# Patient Record
Sex: Male | Born: 1965
Health system: Southern US, Community
[De-identification: ages and names within clinical notes are randomized; demographics above are authoritative.]

---

## 2008-07-24 ENCOUNTER — Emergency Department (HOSPITAL_COMMUNITY): Admission: EM | Admit: 2008-07-24 | Discharge: 2008-07-24 | Payer: Self-pay | Admitting: Family Medicine

## 2011-08-05 LAB — POCT RAPID STREP A: Streptococcus, Group A Screen (Direct): NEGATIVE

## 2013-03-25 ENCOUNTER — Ambulatory Visit: Payer: Self-pay | Admitting: Psychologist

## 2013-03-26 ENCOUNTER — Ambulatory Visit: Payer: Self-pay | Admitting: Psychologist

## 2013-10-24 ENCOUNTER — Encounter (HOSPITAL_COMMUNITY): Payer: Self-pay | Admitting: Emergency Medicine

## 2013-10-24 ENCOUNTER — Emergency Department (INDEPENDENT_AMBULATORY_CARE_PROVIDER_SITE_OTHER)
Admission: EM | Admit: 2013-10-24 | Discharge: 2013-10-24 | Disposition: A | Discharge status: Home / Self Care | Payer: BC Managed Care – PPO | Source: Home / Self Care | Attending: Family Medicine | Admitting: Family Medicine

## 2013-10-24 DIAGNOSIS — J069 Acute upper respiratory infection, unspecified: Secondary | ICD-10-CM

## 2013-10-24 MED ORDER — AZITHROMYCIN 250 MG PO TABS: 250.0000 mg | ORAL_TABLET | Freq: Every day | ORAL | Status: AC

## 2013-10-24 MED ORDER — GUAIFENESIN-CODEINE 100-10 MG/5ML PO SOLN
5.0000 mL | Freq: Every evening | ORAL | Status: AC | PRN
Start: 2013-10-24 — End: ?

## 2013-10-24 MED ORDER — PREDNISONE 20 MG PO TABS: 40.0000 mg | ORAL_TABLET | Freq: Every day | ORAL | Status: DC

## 2013-10-24 MED ORDER — IPRATROPIUM BROMIDE 0.06 % NA SOLN: 2.0000 | Freq: Four times a day (QID) | NASAL | Status: AC

## 2013-10-24 NOTE — ED Provider Notes (Signed)
Jay Pope is a 47 y.o. male who presents to Urgent Care today for 5 days of sore throat congestion runny nose ear pain nighttime nonproductive coughing. Dr. was sick last week with a similar illness. Patient has tried NyQuil which has helped.. No nausea vomiting or diarrhea. Patient feels well otherwise.   History reviewed. No pertinent past medical history. History  Substance Use Topics  . Smoking status: Never Smoker   . Smokeless tobacco: Not on file  . Alcohol Use: No   ROS as above Medications reviewed. No current facility-administered medications for this encounter.   Current Outpatient Prescriptions  Medication Sig Dispense Refill  . azithromycin (ZITHROMAX) 250 MG tablet Take 1 tablet (250 mg total) by mouth daily. Take first 2 tablets together, then 1 every day until finished.  6 tablet  0  . guaiFENesin-codeine 100-10 MG/5ML syrup Take 5 mLs by mouth at bedtime as needed for cough.  120 mL  0  . ipratropium (ATROVENT) 0.06 % nasal spray Place 2 sprays into both nostrils 4 (four) times daily.  15 mL  1  . predniSONE (DELTASONE) 20 MG tablet Take 2 tablets (40 mg total) by mouth daily.  10 tablet  0    Exam:  BP 120/72  Pulse 74  Temp(Src) 97.8 F (36.6 C) (Oral)  Resp 18  SpO2 96% Gen: Well NAD HEENT: EOMI,  MMM tympanic membranes are normal appearing bilaterally. Posterior pharynx with mild cobblestoning. Nontender  maxillary sinus Lungs: Normal work of breathing. CTABL Heart: RRR no MRG Abd: NABS, Soft. NT, ND Exts: Non edematous BL  LE, warm and well perfused.    Assessment and Plan: 47 y.o. male with viral URI. Plan for treatment with codeine containing cough medication Atrovent nasal spray and low dose or duration prednisone. We'll use azithromycin for use if not getting better. Discussed warning signs or symptoms. Please see discharge instructions. Patient expresses understanding.      Rodolph Bong, MD 10/24/13 [id number]

## 2013-10-24 NOTE — ED Notes (Signed)
C/O slight sore throat, fatigue, nasal congestion x 5 days.  No relief with Nyquil.  Daughter had influenza approx 2 wks ago.  Has been drinking plenty of fluids and also taking Advil.  Denies any fevers.

## 2014-12-21 ENCOUNTER — Ambulatory Visit
Admission: RE | Admit: 2014-12-21 | Discharge: 2014-12-21 | Disposition: A | Discharge status: Home / Self Care | Payer: BLUE CROSS/BLUE SHIELD | Source: Ambulatory Visit | Attending: Sports Medicine | Admitting: Sports Medicine

## 2014-12-21 ENCOUNTER — Ambulatory Visit (INDEPENDENT_AMBULATORY_CARE_PROVIDER_SITE_OTHER): Payer: BLUE CROSS/BLUE SHIELD | Admitting: Sports Medicine

## 2014-12-21 ENCOUNTER — Encounter: Payer: Self-pay | Admitting: Sports Medicine

## 2014-12-21 VITALS — BP 111/70 | HR 65 | Ht 69.0 in | Wt 180.0 lb

## 2014-12-21 DIAGNOSIS — M533 Sacrococcygeal disorders, not elsewhere classified: Secondary | ICD-10-CM | POA: Diagnosis not present

## 2014-12-21 DIAGNOSIS — M545 Low back pain, unspecified: Secondary | ICD-10-CM

## 2014-12-21 DIAGNOSIS — M4120 Other idiopathic scoliosis, site unspecified: Secondary | ICD-10-CM | POA: Diagnosis not present

## 2014-12-21 NOTE — Patient Instructions (Signed)
We will obtain updated X-rays of the lumbar spine and contact you about the results.  The following exercise regimen will help with rehab and stabilize your spine.  On days 1,3, and 5: 1. Standing with 5-10 lb bar or weight resting across your shoulders, rotate to left until you feel a stretch, then back to midline. Repeat to opposite side, do 10 x 3 sets. 2. Standing with 5-10 lb bar or weight resting across your shoulders, side-bend to left until you feel a stretch, then back to midline. Repeat to opposite side, do 10 x 3 sets. 3. Standing with 5-10 lb bar or weight resting across your shoulders, bend down with your right elbow going towards your left knee (opposite elbow to knee), stop when you start to feel gentle stretch in your back. Repeat to opposite side, do 10 x 3 sets.  On days 2,4,6: 1. Laying on your back, start with stretch bringing both knees to your chest, hold 5 seconds, then down. Once stretched, do crunches with both knees to chest, 15 times x 3 sets. 2. Laying on your back, stretch bringing opposite knee to opposite elbow, hold 5 seconds, then down. Once stretched, do crunches with opposite elbow to opposite knee, 15 times x 3 sets. Repeat for opposite elbow/knee. 3. On hands and knees, do superman exercise by reaching with one arm and opposite leg extending back. 10 x 3 sets.  Daily: Pretzel stretch (Swing opposite leg over the other off side of couch or bed), hold 10 seconds, repeat 3-5 times.  Follow-up in 6 weeks or sooner if needed.

## 2014-12-21 NOTE — Assessment & Plan Note (Signed)
Thoracic right side-bent, left rotation Lumbar left side-bent, right rotation and right lumbar spasm to SI joint  -Obtain X-rays lumbar spine, will contact patient regarding x-rays. -Extensive HEP to include standing rotation/sidebending exercises, core strengthening, superman, and pretzel stretch -Follow-up in 6 weeks or sooner if needed.

## 2014-12-21 NOTE — Progress Notes (Addendum)
   Subjective:    Patient ID: Jay Pope, male    DOB: [birth year], 49 y.o.   MRN: [id number]  HPI [name] is a 49 year old male who presents with low back pain.  He is a former [employer] for 15 years, and has been dealing with some chronic low back pain. He denies any acute injury.  He was last seen in the mid 90s by Dr. Felix Pacini, and told that he has a sixth lumbar vertebra and a possible disc protrusion at L4-5.  Over the past couple months his pain has been increasing.  Location of pain is in the right low back and in the region of the right SI joint.  He is right-handed Air cabin crew.  Symptoms are a red with movement or twisting.  He occasionally he will note radiation that is sharp and causes right leg to feel like it is going to give way.  He denies any numbness, tingling, or constant weakness.  He takes Advil and stretches occasionally.  He denies any saddle anesthesia, or loss of bladder or bowel.  Past medical history, social history, medications, and allergies were reviewed and are up to date in the chart.  Review of Systems 7 point review of systems was performed and was otherwise negative unless noted in the history of present illness.     Objective:   Physical Exam BP 111/70 mmHg  Pulse 65  Ht 5\' 9"  (1.753 m)  Wt 180 lb (81.647 kg)  BMI 26.57 kg/m2 GEN: The patient is well-developed well-nourished male and in no acute distress.  He is awake alert and oriented x3. SKIN: warm and well-perfused, no rash  EXTR: No lower extremity edema Neuro: Strength 5/5 globally. Sensation intact throughout. DTRs 2/4 bilaterally. No focal deficits. Vasc: +2 bilateral distal pulses. No edema.  MSK: Examination of the lumbar spine standing structurally reveals a right-sided thoracic curve and tightness along the right lumbar paraspinal muscles.  He has a S-shaped scoliosis.  No leg length discrepancy.  Abdominal compression reveals slight tenderness at the right SI joint with poor  motion.  Negative Faber.  Clearly good hip abductor strength.  Weak core musculature.  The scoliotic curve is slightly reducible with side bending.  Negative seated straight leg raise test.  He is overall neurovascularly intact distally.  No foot drop.     Assessment & Plan:  Please see problem based assessment and plan in the problem list.    Addendum:  Xrays showed a bilateral pars defect with some anterolithesis and excess lordosis at L6/S1  I called and left message for patient  We will focus his rehab on lots of abdominal strength work during first 12 weeks

## 2016-06-03 DIAGNOSIS — Z0000 Encounter for general adult medical examination without abnormal findings: Secondary | ICD-10-CM | POA: Diagnosis not present

## 2016-06-03 DIAGNOSIS — Z79899 Other long term (current) drug therapy: Secondary | ICD-10-CM | POA: Diagnosis not present

## 2016-06-03 DIAGNOSIS — Z125 Encounter for screening for malignant neoplasm of prostate: Secondary | ICD-10-CM | POA: Diagnosis not present

## 2016-06-03 DIAGNOSIS — E7800 Pure hypercholesterolemia, unspecified: Secondary | ICD-10-CM | POA: Diagnosis not present

## 2016-12-19 ENCOUNTER — Ambulatory Visit (INDEPENDENT_AMBULATORY_CARE_PROVIDER_SITE_OTHER): Payer: BLUE CROSS/BLUE SHIELD | Admitting: Sports Medicine

## 2016-12-19 ENCOUNTER — Ambulatory Visit: Payer: Self-pay

## 2016-12-19 VITALS — BP 114/70 | Ht 69.0 in | Wt 180.0 lb

## 2016-12-19 DIAGNOSIS — S62235A Other nondisplaced fracture of base of first metacarpal bone, left hand, initial encounter for closed fracture: Secondary | ICD-10-CM | POA: Diagnosis not present

## 2016-12-19 DIAGNOSIS — M25539 Pain in unspecified wrist: Secondary | ICD-10-CM

## 2016-12-19 DIAGNOSIS — S62233A Other displaced fracture of base of first metacarpal bone, unspecified hand, initial encounter for closed fracture: Secondary | ICD-10-CM | POA: Insufficient documentation

## 2016-12-19 DIAGNOSIS — S62002A Unspecified fracture of navicular [scaphoid] bone of left wrist, initial encounter for closed fracture: Secondary | ICD-10-CM | POA: Diagnosis not present

## 2016-12-19 NOTE — Progress Notes (Signed)
   Subjective:    Patient ID: Jay Pope, male    DOB: [birth year], 51 y.o.   MRN: [id number]  HPI Jay Pope is a 51 year old male who has previously been seen by sports medicine for low back pain, found to have bilateral pars defect at L6/S1. He is seen today for a hand injury obtained while skiing. On Saturday (5 days ago) he fell skiing and landed on his hand close to the base of the thumb. He says he had some tenderness there and a lot of swelling. He tried icing and rest of the thumb. Last night the swelling did improve. However, still mildly swollen compared to right side. He didn't notice any erythema or bruising. No warmth. His biggest complaint today is decreased grip strength. He is right handed, but has noticed even decreased strength in his left hand compared to his right than normal. No prior injuries to the hand or wrist.  Past medical history, social history, medications, and allergies were reviewed and are up to date in the chart.  Review of Systems 7 point review of systems was performed and was otherwise negative unless noted in the history of present illness.     Objective:   Physical Exam BP 114/70   Ht 5\' 9"  (1.753 m)   Wt 180 lb (81.6 kg)   BMI 26.58 kg/m  GEN: The patient is well-developed well-nourished male and in no acute distress.  He is awake alert and oriented x3. SKIN: warm and well-perfused, no rash  EXTR: No lower extremity edema Neuro: Strength 5/5 globally in right hand. Strength 4/5 in left hand with grip strength and thumb opposition strength. Sensation intact throughout. Vasc: +2 bilateral distal pulses. Good capillary refill bilaterally. MSK: Examination of the right hand and left hand reveals swelling noted to the thenar aspect of the palm. Point tenderness noted close to the [facility] joint and scaphoid bone. No bony deformities noted. No erythema or bruising noted. Swelling noted to back of left hand compared to the right hand.      Assessment &  Plan:  Please see problem based assessment and plan in the problem list.  1. Closed nondisplaced fracture of scaphoid of left wrist, unspecified portion of scaphoid, initial encounter - [place] LIMITED JOINT SPACE STRUCTURES UP LEFT- chip fracture noted within [facility] joint. Edema noted surrounding joint space. Appears to be free-floating. Normal appearance of scaphoid bone.  - will place in spica splint (covering half of wrist) for 4 weeks - follow-up in 1 month  Patient seen and discussed with sports medicine physician, Dr. Oneida Alar.  Freddrick March, MD [facility] Pediatric[facility], PGY-3 12/19/2016  10:19 AM

## 2016-12-19 NOTE — Patient Instructions (Signed)
1. Wear splint for 4 weeks. 2. Follow-up with Dr. Oneida Alar in 1 month.

## 2017-01-16 ENCOUNTER — Ambulatory Visit (INDEPENDENT_AMBULATORY_CARE_PROVIDER_SITE_OTHER): Payer: BLUE CROSS/BLUE SHIELD | Admitting: Sports Medicine

## 2017-01-16 ENCOUNTER — Encounter (INDEPENDENT_AMBULATORY_CARE_PROVIDER_SITE_OTHER): Payer: Self-pay

## 2017-01-16 ENCOUNTER — Encounter: Payer: Self-pay | Admitting: Sports Medicine

## 2017-01-16 VITALS — BP 126/68

## 2017-01-16 DIAGNOSIS — S62235D Other nondisplaced fracture of base of first metacarpal bone, left hand, subsequent encounter for fracture with routine healing: Secondary | ICD-10-CM

## 2017-01-16 NOTE — Progress Notes (Signed)
  Jay Pope - 51 y.o. male MRN [id number]  Date of birth: 1966-03-19  SUBJECTIVE:  Including CC & ROS.  CC: f/u chip fracture L CMC joint  HPI: Jay Pope is an otherwise healthy 51 yr old male with was diagnosed with a L chip fracture in [facility] joint on 12/19/2016 after a fall during skiing. At that visit, he was placed in a wrist spica. He has been wearing his spica splint consistently, only removing for showers. He reports complete resolution of hand/wrist pain. Denies any swelling of area. No numbness/tingling in hand or fingers. Reports good range of motion in hand/wrist when not wearing the splint. No medications required at this time. Prior to this fracture, he had no hx of injuries in this hand.  No new injuries. He is R handed.   ROS: No fever, chills, swelling, instability, muscle pain, numbness/tingling, redness, otherwise see HPI   PMHx - Updated and reviewed.  Contributory factors include: Negative PSHx - Updated and reviewed.  Contributory factors include:  Negative FHx - Updated and reviewed.  Contributory factors include:  Negative Social Hx - Updated and reviewed. Contributory factors include: Negative Medications - reviewed, none Allergies: ASA  PHYSICAL EXAM:  VS: BP:126/68  HR: bpm  TEMP: ( )  RESP:   HT:    WT:   BMI:  PHYSICAL EXAM: Gen: NAD, alert, cooperative with exam, well-appearing HEENT: clear conjunctiva,  CV:  no edema, capillary refill brisk, normal rate Resp: non-labored Skin: no rashes, normal turgor  Neuro: no gross deficits.  Psych:  alert and oriented MSK: Hands/wrists exam: No edema, erythema, bruising, or bony deformity. No tenderness to palpation on distal forearms, wrists, carpals, metacarpals, or phalanges. No snuffbox or CMC tenderness. Good ROM throughout wrist and hand. Normal thumb apposition, opposition, abduction, and adduction. Normal hand strength, equal between R and L hand. Good grip strength bilaterally. Normal stability at ulnar  collateral ligament. Cap refill normal. Neurovascularly intact.  DATA REVIEWED: Ultrasound read from last visit 12/19/2016 reviewed.  Ultrasound today: Limited ultrasound of the left [facility] joint shows that the previous avulsion fracture has healed. Minimal joint fluid seen.  ASSESSMENT & PLAN:  51yr old otherwise healthy male with chip fracture in L [place] joint diagnosed on 12/19/2016 is here for follow-up. Overall, he is doing well with complete resolution of L hand/wrist pain and edema, and return to normal hand/wrist function. Ultrasound today reveals new bone growth and reattaching of bone chip. Normal scaphoid on ultrasound. No new abnormal findings.  1) Fracture in [facility]  -May resume normal activity, but avoid excessive gripping, twisting, or lifting motions with wrist and hand -Recommended gentle ROM exercises -May tape joint with coban if doing prolonged activity  Follow-up PRN for new or worsening symptoms.  Thereasa Distance, MD [facility], PGY1  Patient seen and evaluated with the resident. I agree with the above plan of care. Patient's ultrasound today suggests healing of his previous chip fracture at his [facility] joint in his left hand. Clinically he is doing very well. No pain. Good strength. He can discontinue his thumb spica brace but I still want him to avoid any heavy lifting or twisting for the next 3-4 weeks. We've given him some Coban and to wrap around his [facility] joint when active. Follow-up as needed.

## 2017-01-30 ENCOUNTER — Ambulatory Visit: Payer: BLUE CROSS/BLUE SHIELD | Admitting: Sports Medicine

## 2017-05-02 ENCOUNTER — Ambulatory Visit (HOSPITAL_COMMUNITY)
Admission: RE | Admit: 2017-05-02 | Discharge: 2017-05-02 | Disposition: A | Discharge status: Home / Self Care | Payer: BLUE CROSS/BLUE SHIELD | Source: Ambulatory Visit | Attending: Orthopedic Surgery | Admitting: Orthopedic Surgery

## 2017-05-02 ENCOUNTER — Other Ambulatory Visit (HOSPITAL_COMMUNITY): Payer: Self-pay | Admitting: Orthopedic Surgery

## 2017-05-02 ENCOUNTER — Other Ambulatory Visit (HOSPITAL_COMMUNITY): Payer: Self-pay

## 2017-05-02 ENCOUNTER — Ambulatory Visit (HOSPITAL_COMMUNITY)
Admission: AD | Admit: 2017-05-02 | Payer: BLUE CROSS/BLUE SHIELD | Source: Ambulatory Visit | Admitting: Orthopedic Surgery

## 2017-05-02 DIAGNOSIS — X58XXXA Exposure to other specified factors, initial encounter: Secondary | ICD-10-CM | POA: Insufficient documentation

## 2017-05-02 DIAGNOSIS — S301XXA Contusion of abdominal wall, initial encounter: Secondary | ICD-10-CM | POA: Diagnosis not present

## 2017-05-02 DIAGNOSIS — J9811 Atelectasis: Secondary | ICD-10-CM | POA: Diagnosis not present

## 2017-05-02 DIAGNOSIS — R52 Pain, unspecified: Secondary | ICD-10-CM

## 2017-05-02 DIAGNOSIS — D171 Benign lipomatous neoplasm of skin and subcutaneous tissue of trunk: Secondary | ICD-10-CM | POA: Diagnosis not present

## 2017-05-02 DIAGNOSIS — R1012 Left upper quadrant pain: Secondary | ICD-10-CM | POA: Diagnosis not present

## 2017-05-02 DIAGNOSIS — S239XXA Sprain of unspecified parts of thorax, initial encounter: Secondary | ICD-10-CM | POA: Diagnosis not present

## 2017-05-02 DIAGNOSIS — R0781 Pleurodynia: Secondary | ICD-10-CM | POA: Diagnosis not present

## 2017-05-02 MED ORDER — IOPAMIDOL (ISOVUE-300) INJECTION 61%
INTRAVENOUS | Status: AC
  Administered 2017-05-02: 100 mL via INTRAVENOUS
  Filled 2017-05-02: qty 100

## 2017-06-30 DIAGNOSIS — E7800 Pure hypercholesterolemia, unspecified: Secondary | ICD-10-CM | POA: Diagnosis not present

## 2017-06-30 DIAGNOSIS — Z79899 Other long term (current) drug therapy: Secondary | ICD-10-CM | POA: Diagnosis not present

## 2017-06-30 DIAGNOSIS — Z0000 Encounter for general adult medical examination without abnormal findings: Secondary | ICD-10-CM | POA: Diagnosis not present

## 2017-06-30 DIAGNOSIS — Z125 Encounter for screening for malignant neoplasm of prostate: Secondary | ICD-10-CM | POA: Diagnosis not present

## 2017-12-01 IMAGING — CT CT ABDOMEN W/ CM
2 of 5 series · 14 of 46 positions shown, 16 images · IV contrast (ISOVUE)
Comparison: Lumbar radiographs 12/21/2014.

CLINICAL DATA: 51-year-old male with fall several days ago with
continued left upper quadrant abdominal pain. Query splenic injury.

EXAM:
CT ABDOMEN WITH CONTRAST
TECHNIQUE: Multidetector CT imaging of the abdomen was performed using the
standard protocol following bolus administration of intravenous
contrast.
CONTRAST:  100 mL [id number] IOPAMIDOL ([id number]) INJECTION 61%

[Series 2: abdomen · axial · 0.80mm/px · z∈[+1204,+1440]mm · 11 of 57 slices shown, 13 images]
[im 5/57  soft-tissue]
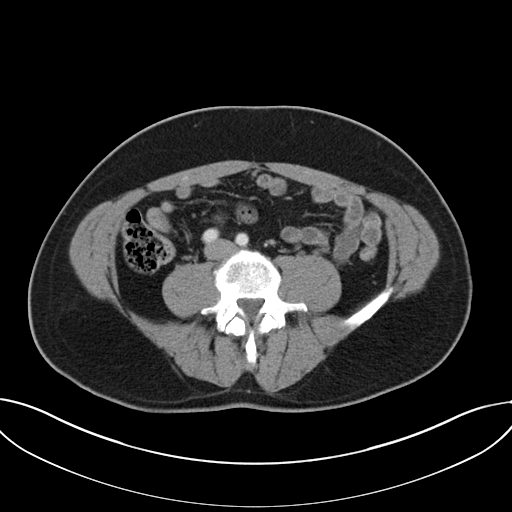
[im 5/57  bone]
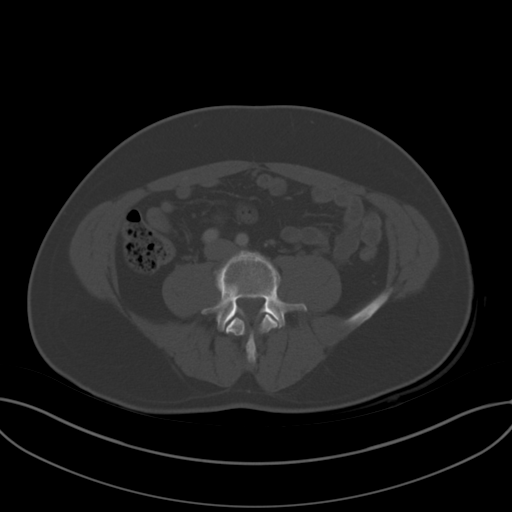
[im 10/57  soft-tissue]
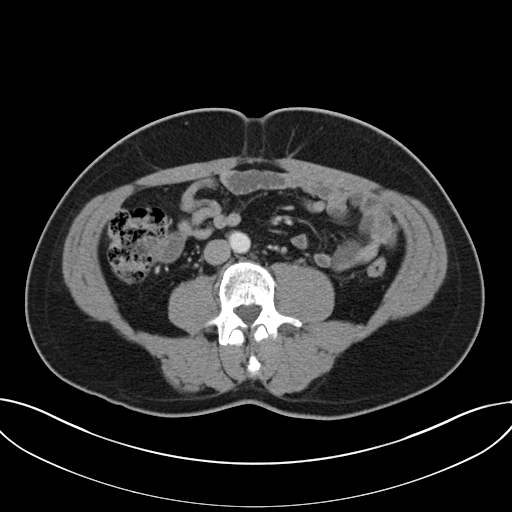
[im 15/57  soft-tissue]
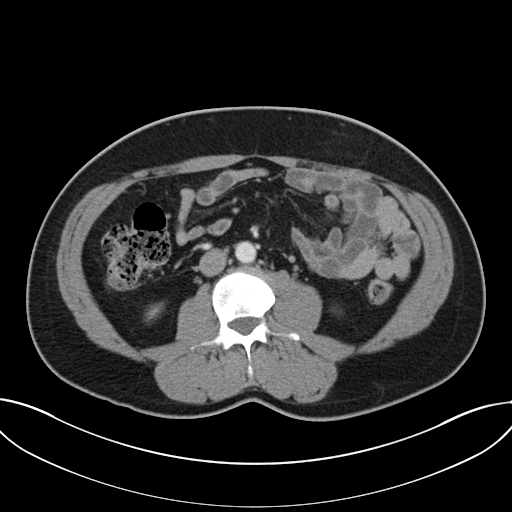
[im 19/57  soft-tissue]
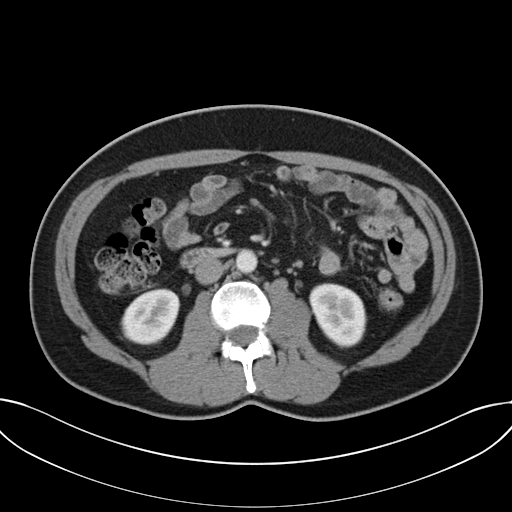
[im 24/57  soft-tissue]
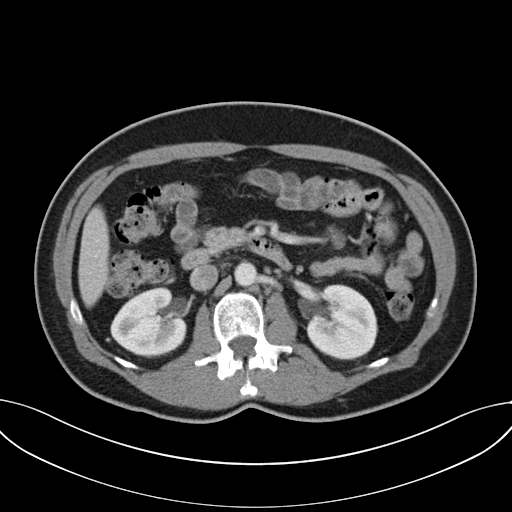
[im 29/57  soft-tissue]
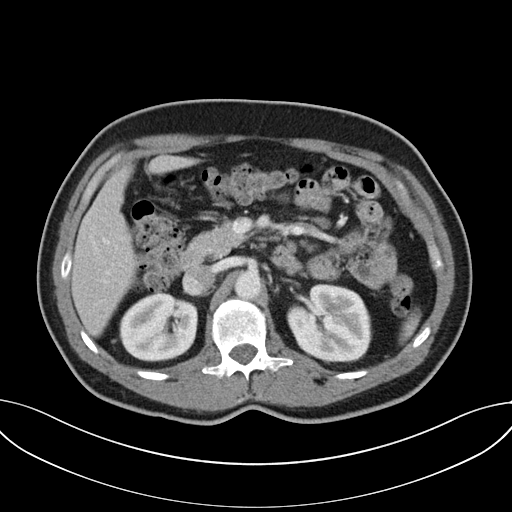
[im 33/57  soft-tissue]
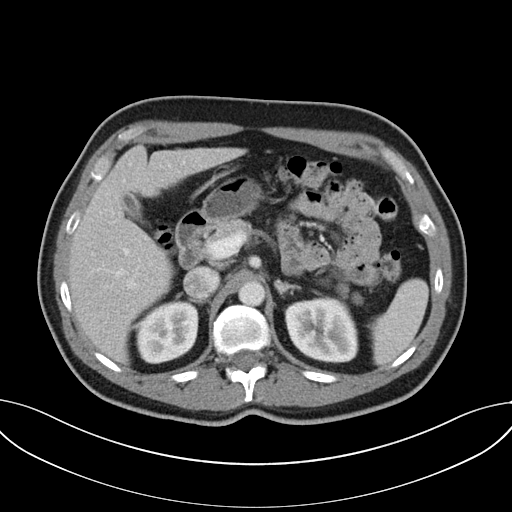
[im 38/57  soft-tissue]
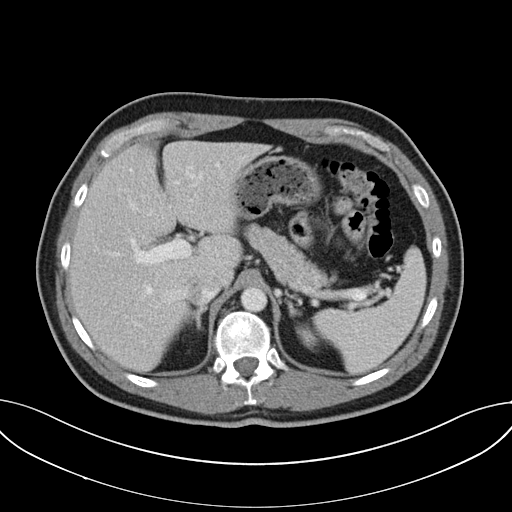
[im 43/57  soft-tissue]
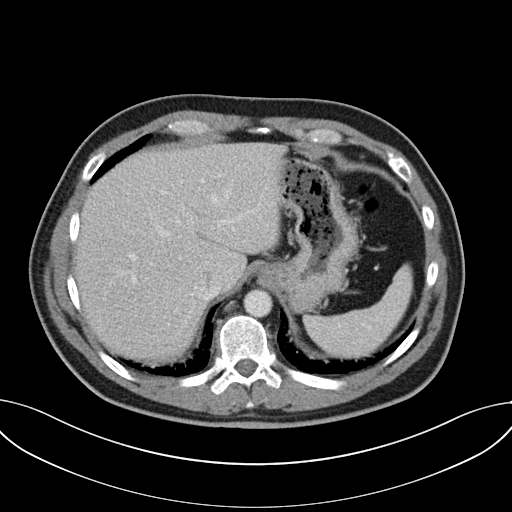
[im 43/57  bone]
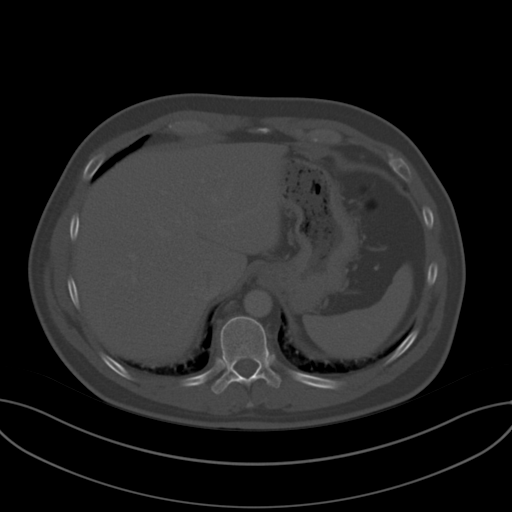
[im 47/57  soft-tissue]
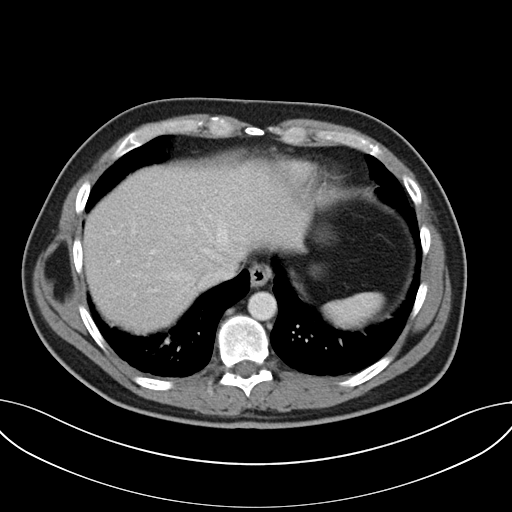
[im 52/57  soft-tissue]
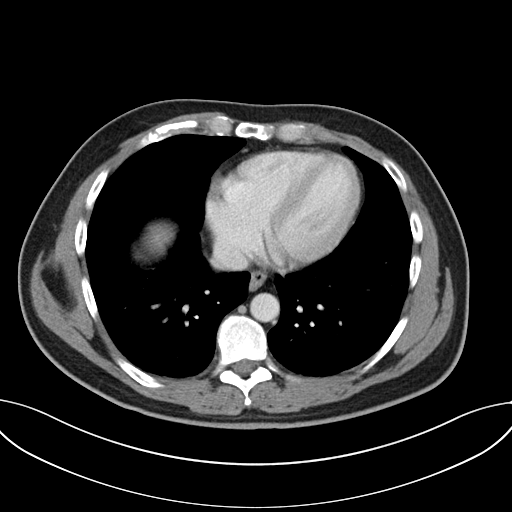

[Series 4: coronal a/|p · coronal · 0.59mm/px · 3 of 188 slices shown]
[im 63/188  soft-tissue]
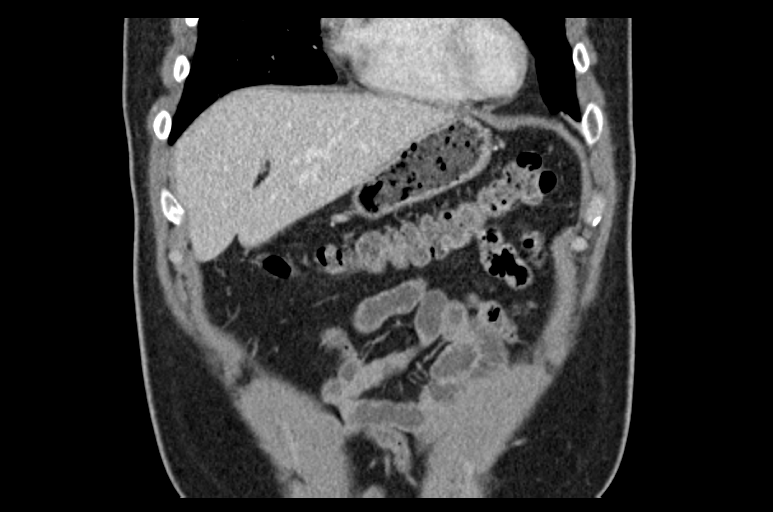
[im 84/188  soft-tissue]
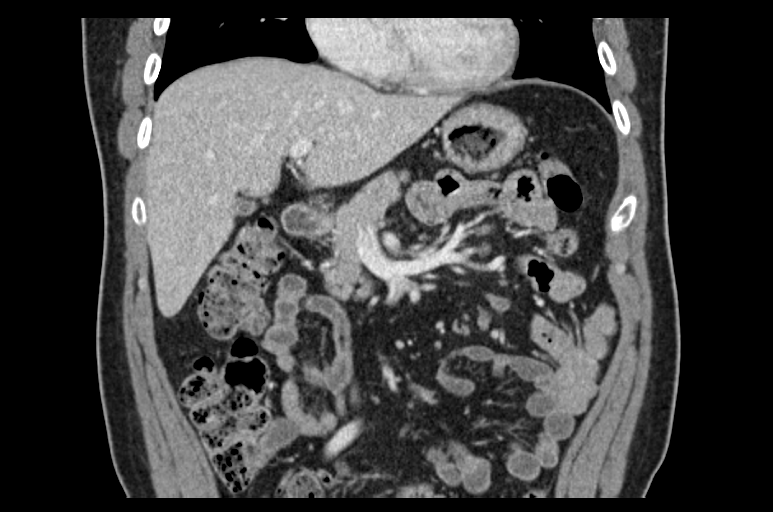
[im 104/188  soft-tissue]
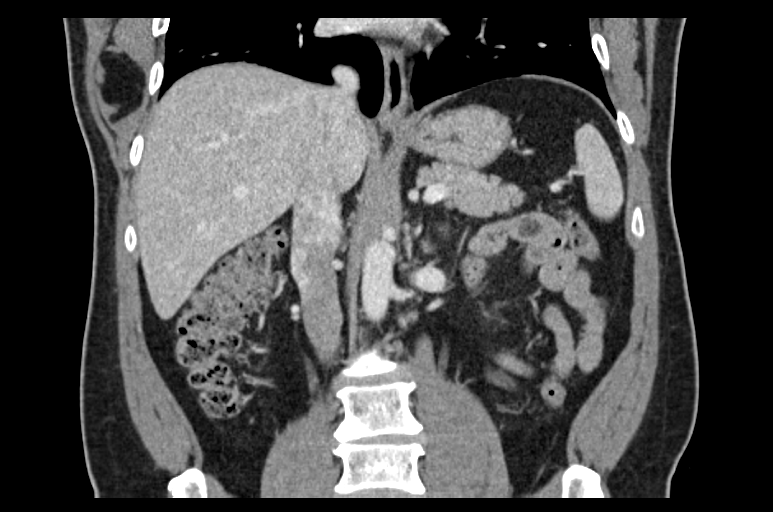

[14 of 46 positions shown; findings below may reference images not displayed]

FINDINGS: Lower chest: Mild bibasilar atelectasis. No pericardial or pleural
effusion.

Hepatobiliary: Negative liver. Gallbladder is contracted but
otherwise normal.

Pancreas: Negative.

Spleen: The spleen appears intact and normal. No left upper quadrant
inflammatory stranding identified.

Adrenals/Urinary Tract: Normal adrenal glands. Bilateral renal
enhancement and contrast excretion is normal.

Stomach/Bowel: Negative visible descending colon and splenic
flexure. Negative transverse colon, hepatic flexure, visible right
colon and appendix. Negative terminal ileum. No dilated or abnormal
small bowel loops identified. Negative stomach and duodenum.

Vascular/Lymphatic: Major arterial structures in the abdomen appear
patent and normal. Portal venous system is patent. No abdominal
lymphadenopathy.

Other: No perisplenic or other abdominal free fluid.  No free air.

Musculoskeletal: No left lower rib fracture identified. Incidental
benign 5 cm lipoma of the right posterolateral chest musculature
(series 2, image 8). No lower chest wall traumatic injury
identified.

There is mild/minimal subcutaneous contusion along the ventral left
abdominal wall on series 4, image 48.

Lumbar spine appears intact. Degenerative appearing retrolisthesis
of L4 on L5, associated right lateral disc and endplate degeneration
at that level. No acute osseous abnormality identified.
IMPRESSION: 1. Mild subcutaneous contusion along the ventral left abdominal
wall, series 4, image 48.
2. Intact spleen and no other acute traumatic injury identified in
the lower chest or abdomen.
3. Incidental benign 5 cm intramuscular lipoma at the right
posterolateral chest wall (series 2, image 8).

## 2018-07-09 DIAGNOSIS — Z0000 Encounter for general adult medical examination without abnormal findings: Secondary | ICD-10-CM | POA: Diagnosis not present

## 2018-07-09 DIAGNOSIS — Z125 Encounter for screening for malignant neoplasm of prostate: Secondary | ICD-10-CM | POA: Diagnosis not present

## 2018-07-09 DIAGNOSIS — Z23 Encounter for immunization: Secondary | ICD-10-CM | POA: Diagnosis not present

## 2018-07-09 DIAGNOSIS — E7800 Pure hypercholesterolemia, unspecified: Secondary | ICD-10-CM | POA: Diagnosis not present

## 2018-07-09 DIAGNOSIS — Z79899 Other long term (current) drug therapy: Secondary | ICD-10-CM | POA: Diagnosis not present

## 2019-06-23 ENCOUNTER — Other Ambulatory Visit: Payer: Self-pay

## 2019-06-23 DIAGNOSIS — Z20822 Contact with and (suspected) exposure to covid-19: Secondary | ICD-10-CM

## 2019-06-24 LAB — NOVEL CORONAVIRUS, NAA: SARS-CoV-2, NAA: NOT DETECTED

## 2019-08-30 ENCOUNTER — Ambulatory Visit (INDEPENDENT_AMBULATORY_CARE_PROVIDER_SITE_OTHER): Payer: 59 | Admitting: Family Medicine

## 2019-08-30 ENCOUNTER — Other Ambulatory Visit: Payer: Self-pay

## 2019-08-30 ENCOUNTER — Encounter: Payer: Self-pay | Admitting: Family Medicine

## 2019-08-30 DIAGNOSIS — M545 Low back pain, unspecified: Secondary | ICD-10-CM | POA: Insufficient documentation

## 2019-08-30 MED ORDER — PREDNISONE 5 MG PO TABS: ORAL_TABLET | ORAL | 0 refills | Status: AC

## 2019-08-30 MED ORDER — CYCLOBENZAPRINE HCL 10 MG PO TABS: 10.0000 mg | ORAL_TABLET | Freq: Three times a day (TID) | ORAL | 0 refills | Status: AC | PRN

## 2019-08-30 NOTE — Progress Notes (Signed)
Jay Pope - 53 y.o. male MRN [id number]  Date of birth: September 14, 1966  SUBJECTIVE:  Including CC & ROS.  Chief Complaint  Patient presents with  . Back Pain    mid to low back    Jay Pope is a 53 y.o. male that is presenting with right-sided low back pain.  This been ongoing for a few days.  The pain is intermittent in nature.  It is localized to the right lower back.  He has a history of similar pain from years ago.  He denies any specific inciting event.  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  Denies any specific radicular symptoms.  Does have altered sensation on the toes of the left and right foot.  Pain is sharp in nature.  Seems to be worse with prolonged sitting or standing or walking.  Does get relief if he lies in a certain position.  Has had improvement with ibuprofen.  Denies history of kidney stones.  No changes in bowel movements.   Review of Systems  Constitutional: Negative for fever.  HENT: Negative for congestion.   Respiratory: Negative for cough.   Cardiovascular: Negative for chest pain.  Gastrointestinal: Negative for abdominal pain.  Musculoskeletal: Positive for back pain.  Skin: Negative for color change.  Neurological: Negative for weakness.  Hematological: Negative for adenopathy.    HISTORY: Past Medical, Surgical, Social, and Family History Reviewed & Updated per EMR.   Pertinent Historical Findings include:  No past medical history on file.  No past surgical history on file.  Allergies  Allergen Reactions  . Aspirin     Throat swelling    No family history on file.   Social History   Socioeconomic History  . Marital status: Married    Spouse name: Not on file  . Number of children: Not on file  . Years of education: Not on file  . Highest education level: Not on file  Occupational History  . Not on file  Social Needs  . Financial resource strain: Not on file  . Food insecurity    Worry: Not on file    Inability:  Not on file  . Transportation needs    Medical: Not on file    Non-medical: Not on file  Tobacco Use  . Smoking status: Never Smoker  . Smokeless tobacco: Never Used  Substance and Sexual Activity  . Alcohol use: No  . Drug use: No  . Sexual activity: Not on file  Lifestyle  . Physical activity    Days per week: Not on file    Minutes per session: Not on file  . Stress: Not on file  Relationships  . Social Herbalist on phone: Not on file    Gets together: Not on file    Attends religious service: Not on file    Active member of club or organization: Not on file    Attends meetings of clubs or organizations: Not on file    Relationship status: Not on file  . Intimate partner violence    Fear of current or ex partner: Not on file    Emotionally abused: Not on file    Physically abused: Not on file    Forced sexual activity: Not on file  Other Topics Concern  . Not on file  Social History Narrative  . Not on file     PHYSICAL EXAM:  VS: BP 114/74   Pulse (!) 59  Ht 5\' 9"  (1.753 m)   Wt 180 lb (81.6 kg)   BMI 26.58 kg/m  Physical Exam Gen: NAD, alert, cooperative with exam, well-appearing ENT: normal lips, normal nasal mucosa,  Eye: normal EOM, normal conjunctiva and lids CV:  no edema, +2 pedal pulses   Resp: no accessory muscle use, non-labored,   Skin: no rashes, no areas of induration  Neuro: normal tone, normal sensation to touch Psych:  normal insight, alert and oriented MSK:  Back: No tenderness to palpation over the midline lumbar spine or of the right or left paraspinal muscles. Scoliosis evident on flexion. Limited extension. Normal strength resistance with hip flexion. Some instability with one leg standing on the right. Normal internal and external rotation of the hips. Negative straight leg raise. Neurovascularly intact     ASSESSMENT & PLAN:   Low back pain History of scoliosis.  Pain acute in nature and likely spasm related.   No radicular symptoms at this time. -Prednisone. -Flexeril. -Counseled on home exercise therapy and supportive care. -If no improvement can consider physical therapy or imaging.

## 2019-08-30 NOTE — Patient Instructions (Signed)
Nice to meet you Please try the medicine  Please try heat  Please try the exercises  Please do not take prednisone with advil or ibuprofen. You can take these after the prednisone is finished.  Please send me a message in MyChart with any questions or updates.  Please see me back in 4 weeks or sooner if needed.   --Dr. Raeford Razor

## 2019-08-30 NOTE — Assessment & Plan Note (Signed)
History of scoliosis.  Pain acute in nature and likely spasm related.  No radicular symptoms at this time. -Prednisone. -Flexeril. -Counseled on home exercise therapy and supportive care. -If no improvement can consider physical therapy or imaging.

## 2019-10-04 ENCOUNTER — Other Ambulatory Visit: Payer: Self-pay

## 2019-10-04 DIAGNOSIS — Z20822 Contact with and (suspected) exposure to covid-19: Secondary | ICD-10-CM

## 2019-10-05 ENCOUNTER — Telehealth: Payer: 59 | Admitting: Nurse Practitioner

## 2019-10-05 ENCOUNTER — Encounter (INDEPENDENT_AMBULATORY_CARE_PROVIDER_SITE_OTHER): Payer: Self-pay

## 2019-10-05 DIAGNOSIS — Z20828 Contact with and (suspected) exposure to other viral communicable diseases: Secondary | ICD-10-CM

## 2019-10-05 DIAGNOSIS — R52 Pain, unspecified: Secondary | ICD-10-CM

## 2019-10-05 DIAGNOSIS — Z20822 Contact with and (suspected) exposure to covid-19: Secondary | ICD-10-CM

## 2019-10-05 DIAGNOSIS — R059 Cough, unspecified: Secondary | ICD-10-CM

## 2019-10-05 DIAGNOSIS — R05 Cough: Secondary | ICD-10-CM

## 2019-10-05 LAB — NOVEL CORONAVIRUS, NAA: SARS-CoV-2, NAA: DETECTED — AB

## 2019-10-05 MED ORDER — BENZONATATE 100 MG PO CAPS: 100.0000 mg | ORAL_CAPSULE | Freq: Three times a day (TID) | ORAL | 0 refills | Status: AC | PRN

## 2019-10-05 NOTE — Progress Notes (Signed)
E-Visit for Corona Virus Screening   Your current symptoms could be consistent with the coronavirus.  Many health care providers can now test patients at their office but not all are.  [facility] has multiple testing sites. For information on our COVID testing locations and hours go to [email or web address]  Please quarantine yourself while awaiting your test results.  We are enrolling you in our [place] for [place] . Daily you will receive a questionnaire within the [facility] website. Our COVID 19 response team willl be monitoriing your responses daily. Please continue good preventive care measures, including:  frequent hand-washing, avoid touching your face, cover coughs/sneezes, stay out of crowds and keep a 6 foot distance from others.     You can go to one of the  testing sites listed below, while they are opened (see hours). You do not need a doctors order to be tested for covid.You do need to self-isolate until your results return and if positive 14 days from when your symptoms started and until you are 3 days symptom free.   Testing Locations (Monday - Friday, 10a.m. - 3:30 p.m.)   [place]: [facility] Entrance), [address], [place], [place]: [address], [place], [place], [place] (entrance off [address] (Closed each Monday): [address], [place], [place] - the short stay covered drive at [facility] (Use the [employer] entrance to [facility] - Eat next to [place] is a respiratory illness with symptoms that are similar to the flu. Symptoms are typically mild to moderate, but there have been cases of severe illness and death due to the virus. The following symptoms may appear 2-14 days after exposure: . Fever . Cough . Shortness of breath or difficulty  breathing . Chills . Repeated shaking with chills . Muscle pain . Headache . Sore throat . New loss of taste or smell . Fatigue . Congestion or runny nose . Nausea or vomiting . Diarrhea  If you develop fever/cough/breathlessness, please stay home for 10 days with improving symptoms and until you have had 24 hours of no fever (without taking a fever reducer).  Go to the nearest hospital ED for assessment if fever/cough/breathlessness are severe or illness seems like a threat to life.  It is vitally important that if you feel that you have an infection such as this virus or any other virus that you stay home and away from places where you may spread it to others.  You should avoid contact with people age 100 and older.   You should wear a mask or cloth face covering over your nose and mouth if you must be around other people or animals, including pets (even at home). Try to stay at least 6 feet away from other people. This will protect the people around you.  You can use medication such as A prescription cough medication called Tessalon Perles 100 mg. You may take 1-2 capsules every 8 hours as needed for cough  You may also take acetaminophen (Tylenol) as needed for fever.   Reduce your risk of any infection by using the same precautions used for avoiding the common cold or flu:  [place] Kitchen Wash your hands often with soap and warm water for at least 20 seconds.  If soap and water are not readily available, use an alcohol-based hand sanitizer with at least 60%  alcohol.  . If coughing or sneezing, cover your mouth and nose by coughing or sneezing into the elbow areas of your shirt or coat, into a tissue or into your sleeve (not your hands). . Avoid shaking hands with others and consider head nods or verbal greetings only. . Avoid touching your eyes, nose, or mouth with unwashed hands.  . Avoid close contact with people who are sick. . Avoid places or events with large numbers of people in one location,  like concerts or sporting events. . Carefully consider travel plans you have or are making. . If you are planning any travel outside or inside the [place], visit the CDC's Travelers' Health webpage for the latest health notices. . If you have some symptoms but not all symptoms, continue to monitor at home and seek medical attention if your symptoms worsen. . If you are having a medical emergency, call 911.  HOME CARE . Only take medications as instructed by your medical team. . Drink plenty of fluids and get plenty of rest. . A steam or ultrasonic humidifier can help if you have congestion.   GET HELP RIGHT AWAY IF YOU HAVE EMERGENCY WARNING SIGNS** FOR COVID-19. If you or someone is showing any of these signs seek emergency medical care immediately. Call 911 or proceed to your closest emergency facility if: . You develop worsening high fever. . Trouble breathing . Bluish lips or face . Persistent pain or pressure in the chest . New confusion . Inability to wake or stay awake . You cough up blood. . Your symptoms become more severe  **This list is not all possible symptoms. Contact your medical provider for any symptoms that are sever or concerning to you.   MAKE SURE YOU   Understand these instructions.  Will watch your condition.  Will get help right away if you are not doing well or get worse.  Your e-visit answers were reviewed by a board certified advanced clinical practitioner to complete your personal care plan.  Depending on the condition, your plan could have included both over the counter or prescription medications.  If there is a problem please reply once you have received a response from your provider.  Your safety is important to [place].  If you have drug allergies check your prescription carefully.    You can use MyChart to ask questions about today's visit, request a non-urgent call back, or ask for a work or school excuse for 24 hours related to this e-Visit. If it has  been greater than 24 hours you will need to follow up with your provider, or enter a new e-Visit to address those concerns. You will get an e-mail in the next two days asking about your experience.  I hope that your e-visit has been valuable and will speed your recovery. Thank you for using e-visits.   5-10 minutes spent reviewing and documenting in chart.

## 2019-10-05 NOTE — Addendum Note (Signed)
Addended by: Chevis Pretty on: 10/05/2019 06:17 PM   Modules accepted: Orders

## 2019-10-07 ENCOUNTER — Encounter (INDEPENDENT_AMBULATORY_CARE_PROVIDER_SITE_OTHER): Payer: Self-pay

## 2019-10-08 ENCOUNTER — Encounter (INDEPENDENT_AMBULATORY_CARE_PROVIDER_SITE_OTHER): Payer: Self-pay

## 2019-10-09 ENCOUNTER — Encounter (INDEPENDENT_AMBULATORY_CARE_PROVIDER_SITE_OTHER): Payer: Self-pay

## 2019-10-10 ENCOUNTER — Encounter (INDEPENDENT_AMBULATORY_CARE_PROVIDER_SITE_OTHER): Payer: Self-pay

## 2019-10-11 ENCOUNTER — Encounter (INDEPENDENT_AMBULATORY_CARE_PROVIDER_SITE_OTHER): Payer: Self-pay

## 2019-10-12 ENCOUNTER — Encounter (INDEPENDENT_AMBULATORY_CARE_PROVIDER_SITE_OTHER): Payer: Self-pay

## 2019-10-13 ENCOUNTER — Encounter (INDEPENDENT_AMBULATORY_CARE_PROVIDER_SITE_OTHER): Payer: Self-pay

## 2019-10-14 ENCOUNTER — Encounter (INDEPENDENT_AMBULATORY_CARE_PROVIDER_SITE_OTHER): Payer: Self-pay

## 2020-08-10 DIAGNOSIS — E7800 Pure hypercholesterolemia, unspecified: Secondary | ICD-10-CM | POA: Diagnosis not present

## 2020-08-10 DIAGNOSIS — Z79899 Other long term (current) drug therapy: Secondary | ICD-10-CM | POA: Diagnosis not present

## 2020-08-10 DIAGNOSIS — Z0000 Encounter for general adult medical examination without abnormal findings: Secondary | ICD-10-CM | POA: Diagnosis not present

## 2020-08-10 DIAGNOSIS — Z23 Encounter for immunization: Secondary | ICD-10-CM | POA: Diagnosis not present

## 2020-08-10 DIAGNOSIS — Z125 Encounter for screening for malignant neoplasm of prostate: Secondary | ICD-10-CM | POA: Diagnosis not present

## 2021-12-26 DIAGNOSIS — D2261 Melanocytic nevi of right upper limb, including shoulder: Secondary | ICD-10-CM | POA: Diagnosis not present

## 2021-12-26 DIAGNOSIS — C44319 Basal cell carcinoma of skin of other parts of face: Secondary | ICD-10-CM | POA: Diagnosis not present

## 2021-12-26 DIAGNOSIS — D2262 Melanocytic nevi of left upper limb, including shoulder: Secondary | ICD-10-CM | POA: Diagnosis not present

## 2021-12-26 DIAGNOSIS — D225 Melanocytic nevi of trunk: Secondary | ICD-10-CM | POA: Diagnosis not present

## 2021-12-26 DIAGNOSIS — L918 Other hypertrophic disorders of the skin: Secondary | ICD-10-CM | POA: Diagnosis not present

## 2021-12-26 DIAGNOSIS — D485 Neoplasm of uncertain behavior of skin: Secondary | ICD-10-CM | POA: Diagnosis not present

## 2022-02-11 DIAGNOSIS — L814 Other melanin hyperpigmentation: Secondary | ICD-10-CM | POA: Diagnosis not present

## 2022-02-11 DIAGNOSIS — C44319 Basal cell carcinoma of skin of other parts of face: Secondary | ICD-10-CM | POA: Diagnosis not present

## 2022-02-11 DIAGNOSIS — L821 Other seborrheic keratosis: Secondary | ICD-10-CM | POA: Diagnosis not present

## 2022-04-09 DIAGNOSIS — L814 Other melanin hyperpigmentation: Secondary | ICD-10-CM | POA: Diagnosis not present

## 2022-04-09 DIAGNOSIS — Z85828 Personal history of other malignant neoplasm of skin: Secondary | ICD-10-CM | POA: Diagnosis not present

## 2022-04-09 DIAGNOSIS — D2261 Melanocytic nevi of right upper limb, including shoulder: Secondary | ICD-10-CM | POA: Diagnosis not present

## 2022-04-09 DIAGNOSIS — D2262 Melanocytic nevi of left upper limb, including shoulder: Secondary | ICD-10-CM | POA: Diagnosis not present

## 2022-04-09 DIAGNOSIS — L570 Actinic keratosis: Secondary | ICD-10-CM | POA: Diagnosis not present

## 2022-07-18 DIAGNOSIS — D179 Benign lipomatous neoplasm, unspecified: Secondary | ICD-10-CM | POA: Diagnosis not present

## 2022-07-18 DIAGNOSIS — Z803 Family history of malignant neoplasm of breast: Secondary | ICD-10-CM | POA: Diagnosis not present

## 2022-07-18 DIAGNOSIS — Z125 Encounter for screening for malignant neoplasm of prostate: Secondary | ICD-10-CM | POA: Diagnosis not present

## 2022-07-18 DIAGNOSIS — Z8041 Family history of malignant neoplasm of ovary: Secondary | ICD-10-CM | POA: Diagnosis not present

## 2022-07-18 DIAGNOSIS — Z789 Other specified health status: Secondary | ICD-10-CM | POA: Diagnosis not present

## 2022-07-18 DIAGNOSIS — E785 Hyperlipidemia, unspecified: Secondary | ICD-10-CM | POA: Diagnosis not present

## 2023-02-17 ENCOUNTER — Encounter: Payer: Self-pay | Admitting: *Deleted
